# Patient Record
Sex: Male | Born: 1985 | Race: White | Hispanic: No | Marital: Married | Smoking: Current some day smoker
Health system: Southern US, Community
[De-identification: ages and names within clinical notes are randomized; demographics above are authoritative.]

## PROBLEM LIST (undated history)

## (undated) DIAGNOSIS — Z8619 Personal history of other infectious and parasitic diseases: Secondary | ICD-10-CM

## (undated) DIAGNOSIS — B019 Varicella without complication: Secondary | ICD-10-CM

## (undated) HISTORY — PX: KNEE ARTHROSCOPY WITH ANTERIOR CRUCIATE LIGAMENT (ACL) REPAIR: SHX5644

## (undated) HISTORY — DX: Varicella without complication: B01.9

## (undated) HISTORY — DX: Personal history of other infectious and parasitic diseases: Z86.19

---
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
## 2007-04-27 ENCOUNTER — Ambulatory Visit: Payer: Self-pay | Admitting: Family Medicine

## 2010-05-10 ENCOUNTER — Encounter: Payer: Self-pay | Admitting: Family Medicine

## 2010-11-09 ENCOUNTER — Emergency Department (HOSPITAL_COMMUNITY): Payer: 59

## 2010-11-09 ENCOUNTER — Emergency Department (HOSPITAL_COMMUNITY)
Admission: EM | Admit: 2010-11-09 | Discharge: 2010-11-09 | Disposition: A | Discharge status: Home / Self Care | Payer: 59 | Attending: Emergency Medicine | Admitting: Emergency Medicine

## 2010-11-09 DIAGNOSIS — N200 Calculus of kidney: Secondary | ICD-10-CM | POA: Insufficient documentation

## 2010-11-09 DIAGNOSIS — R109 Unspecified abdominal pain: Secondary | ICD-10-CM | POA: Insufficient documentation

## 2010-11-09 DIAGNOSIS — N201 Calculus of ureter: Secondary | ICD-10-CM | POA: Insufficient documentation

## 2010-11-09 DIAGNOSIS — M549 Dorsalgia, unspecified: Secondary | ICD-10-CM | POA: Insufficient documentation

## 2010-11-09 LAB — URINALYSIS, ROUTINE W REFLEX MICROSCOPIC
Glucose, UA: NEGATIVE mg/dL
Leukocytes, UA: NEGATIVE
Nitrite: NEGATIVE
Protein, ur: NEGATIVE mg/dL
Specific Gravity, Urine: 1.02 (ref 1.005–1.030)
Urobilinogen, UA: 0.2 mg/dL (ref 0.0–1.0)

## 2010-11-09 LAB — URINE MICROSCOPIC-ADD ON

## 2012-05-29 ENCOUNTER — Emergency Department (HOSPITAL_COMMUNITY)
Admission: EM | Admit: 2012-05-29 | Discharge: 2012-05-29 | Disposition: A | Discharge status: Home / Self Care | Payer: Worker's Compensation | Attending: Emergency Medicine | Admitting: Emergency Medicine

## 2012-05-29 ENCOUNTER — Emergency Department (HOSPITAL_COMMUNITY): Payer: Worker's Compensation | Admitting: Anesthesiology

## 2012-05-29 ENCOUNTER — Encounter (HOSPITAL_COMMUNITY): Payer: Self-pay | Admitting: Anesthesiology

## 2012-05-29 ENCOUNTER — Encounter (HOSPITAL_COMMUNITY)
Admission: EM | Disposition: A | Discharge status: Home / Self Care | Payer: Self-pay | Source: Home / Self Care | Attending: Emergency Medicine

## 2012-05-29 ENCOUNTER — Encounter (HOSPITAL_COMMUNITY): Payer: Self-pay | Admitting: *Deleted

## 2012-05-29 ENCOUNTER — Emergency Department (HOSPITAL_COMMUNITY): Payer: Worker's Compensation

## 2012-05-29 DIAGNOSIS — W260XXA Contact with knife, initial encounter: Secondary | ICD-10-CM | POA: Insufficient documentation

## 2012-05-29 DIAGNOSIS — S61509A Unspecified open wound of unspecified wrist, initial encounter: Secondary | ICD-10-CM | POA: Insufficient documentation

## 2012-05-29 DIAGNOSIS — S55109A Unspecified injury of radial artery at forearm level, unspecified arm, initial encounter: Secondary | ICD-10-CM | POA: Insufficient documentation

## 2012-05-29 DIAGNOSIS — S51819A Laceration without foreign body of unspecified forearm, initial encounter: Secondary | ICD-10-CM

## 2012-05-29 DIAGNOSIS — Y990 Civilian activity done for income or pay: Secondary | ICD-10-CM | POA: Insufficient documentation

## 2012-05-29 DIAGNOSIS — Y9269 Other specified industrial and construction area as the place of occurrence of the external cause: Secondary | ICD-10-CM | POA: Insufficient documentation

## 2012-05-29 HISTORY — PX: ARTERY REPAIR: SHX559

## 2012-05-29 HISTORY — PX: EMBOLECTOMY: SHX44

## 2012-05-29 LAB — CBC
Hemoglobin: 16 g/dL (ref 13.0–17.0)
MCHC: 34.9 g/dL (ref 30.0–36.0)

## 2012-05-29 LAB — BASIC METABOLIC PANEL
BUN: 13 mg/dL (ref 6–23)
Creatinine, Ser: 1 mg/dL (ref 0.50–1.35)

## 2012-05-29 LAB — PROTIME-INR
INR: 0.99 (ref 0.00–1.49)
Prothrombin Time: 13 seconds (ref 11.6–15.2)

## 2012-05-29 SURGERY — REPAIR, ARTERY, BRACHIAL
Anesthesia: Monitor Anesthesia Care | Site: Wrist | Laterality: Left | Wound class: Dirty or Infected

## 2012-05-29 MED ORDER — PROPOFOL 10 MG/ML IV EMUL
INTRAVENOUS | Status: DC | PRN
  Administered 2012-05-29: 120 ug/kg/min via INTRAVENOUS

## 2012-05-29 MED ORDER — MIDAZOLAM HCL 5 MG/5ML IJ SOLN
INTRAMUSCULAR | Status: DC | PRN
  Administered 2012-05-29 (×2): 1 mg via INTRAVENOUS
  Administered 2012-05-29: 2 mg via INTRAVENOUS

## 2012-05-29 MED ORDER — FENTANYL CITRATE 0.05 MG/ML IJ SOLN
INTRAMUSCULAR | Status: DC | PRN
  Administered 2012-05-29 (×3): 50 ug via INTRAVENOUS

## 2012-05-29 MED ORDER — SODIUM CHLORIDE 0.9 % IR SOLN
Status: DC | PRN
  Administered 2012-05-29: 1

## 2012-05-29 MED ORDER — CEFAZOLIN SODIUM-DEXTROSE 2-3 GM-% IV SOLR
INTRAVENOUS | Status: DC | PRN
  Administered 2012-05-29: 2 g via INTRAVENOUS

## 2012-05-29 MED ORDER — HYDROMORPHONE HCL PF 1 MG/ML IJ SOLN: 0.2500 mg | INTRAMUSCULAR | Status: DC | PRN

## 2012-05-29 MED ORDER — OXYCODONE HCL 5 MG PO TABS: 5.0000 mg | ORAL_TABLET | Freq: Once | ORAL | Status: DC | PRN

## 2012-05-29 MED ORDER — LACTATED RINGERS IV SOLN
INTRAVENOUS | Status: DC | PRN
  Administered 2012-05-29: 18:00:00 via INTRAVENOUS

## 2012-05-29 MED ORDER — OXYCODONE HCL 5 MG/5ML PO SOLN: 5.0000 mg | ORAL | Status: DC | PRN

## 2012-05-29 MED ORDER — PROPOFOL 10 MG/ML IV BOLUS
INTRAVENOUS | Status: DC | PRN
  Administered 2012-05-29: 50 mg via INTRAVENOUS

## 2012-05-29 MED ORDER — DEXTROSE 5 % IV SOLN
INTRAVENOUS | Status: DC | PRN
  Administered 2012-05-29: 19:00:00 via INTRAVENOUS

## 2012-05-29 MED ORDER — LIDOCAINE-EPINEPHRINE 0.5 %-1:200000 IJ SOLN
INTRAMUSCULAR | Status: DC | PRN
  Administered 2012-05-29: 8 mL

## 2012-05-29 MED ORDER — TETANUS-DIPHTH-ACELL PERTUSSIS 5-2.5-18.5 LF-MCG/0.5 IM SUSP
0.5000 mL | Freq: Once | INTRAMUSCULAR | Status: AC
  Administered 2012-05-29: 0.5 mL via INTRAMUSCULAR
  Filled 2012-05-29: qty 0.5

## 2012-05-29 MED ORDER — METOCLOPRAMIDE HCL 5 MG/ML IJ SOLN: 10.0000 mg | Freq: Once | INTRAMUSCULAR | Status: DC | PRN

## 2012-05-29 MED ORDER — SODIUM CHLORIDE 0.9 % IR SOLN
Status: DC | PRN
  Administered 2012-05-29: 20:00:00

## 2012-05-29 MED ORDER — OXYCODONE HCL 5 MG/5ML PO SOLN: 5.0000 mg | Freq: Once | ORAL | Status: DC | PRN

## 2012-05-29 MED ORDER — ONDANSETRON HCL 4 MG/2ML IJ SOLN
INTRAMUSCULAR | Status: DC | PRN
  Administered 2012-05-29: 4 mg via INTRAVENOUS

## 2012-05-29 SURGICAL SUPPLY — 38 items
BANDAGE ESMARK 6X9 LF (GAUZE/BANDAGES/DRESSINGS) ×1 IMPLANT
BENZOIN TINCTURE PRP APPL 2/3 (GAUZE/BANDAGES/DRESSINGS) IMPLANT
BNDG ESMARK 4X9 LF (GAUZE/BANDAGES/DRESSINGS) IMPLANT
BNDG ESMARK 6X9 LF (GAUZE/BANDAGES/DRESSINGS) ×2
CANISTER SUCTION 2500CC (MISCELLANEOUS) ×2 IMPLANT
CATH EMB 3FR 40CM (CATHETERS) ×2 IMPLANT
CLIP LIGATING EXTRA MED SLVR (CLIP) ×2 IMPLANT
CLIP LIGATING EXTRA SM BLUE (MISCELLANEOUS) ×2 IMPLANT
CLOTH BEACON ORANGE TIMEOUT ST (SAFETY) ×2 IMPLANT
CLSR STERI-STRIP ANTIMIC 1/2X4 (GAUZE/BANDAGES/DRESSINGS) ×2 IMPLANT
COVER SURGICAL LIGHT HANDLE (MISCELLANEOUS) ×2 IMPLANT
CUFF TOURNIQUET SINGLE 18IN (TOURNIQUET CUFF) ×2 IMPLANT
CUFF TOURNIQUET SINGLE 24IN (TOURNIQUET CUFF) IMPLANT
CUFF TOURNIQUET SINGLE 34IN LL (TOURNIQUET CUFF) IMPLANT
DECANTER SPIKE VIAL GLASS SM (MISCELLANEOUS) ×2 IMPLANT
ELECT REM PT RETURN 9FT ADLT (ELECTROSURGICAL) ×2
ELECTRODE REM PT RTRN 9FT ADLT (ELECTROSURGICAL) ×1 IMPLANT
GLOVE SS BIOGEL STRL SZ 7.5 (GLOVE) ×1 IMPLANT
GLOVE SUPERSENSE BIOGEL SZ 7.5 (GLOVE) ×1
GOWN STRL NON-REIN LRG LVL3 (GOWN DISPOSABLE) ×6 IMPLANT
KIT BASIN OR (CUSTOM PROCEDURE TRAY) ×2 IMPLANT
KIT ROOM TURNOVER OR (KITS) ×2 IMPLANT
NS IRRIG 1000ML POUR BTL (IV SOLUTION) ×2 IMPLANT
PACK CV ACCESS (CUSTOM PROCEDURE TRAY) ×2 IMPLANT
PAD ARMBOARD 7.5X6 YLW CONV (MISCELLANEOUS) ×4 IMPLANT
PAD CAST 4YDX4 CTTN HI CHSV (CAST SUPPLIES) IMPLANT
PADDING CAST COTTON 4X4 STRL (CAST SUPPLIES)
SPONGE GAUZE 4X4 12PLY (GAUZE/BANDAGES/DRESSINGS) IMPLANT
STAPLER VISISTAT 35W (STAPLE) IMPLANT
STRIP CLOSURE SKIN 1/2X4 (GAUZE/BANDAGES/DRESSINGS) IMPLANT
SUT PROLENE 6 0 CC (SUTURE) ×4 IMPLANT
SUT VIC AB 3-0 SH 27 (SUTURE) ×1
SUT VIC AB 3-0 SH 27X BRD (SUTURE) ×1 IMPLANT
TAPE CLOTH SURG 4X10 WHT LF (GAUZE/BANDAGES/DRESSINGS) ×2 IMPLANT
TOWEL OR 17X24 6PK STRL BLUE (TOWEL DISPOSABLE) ×2 IMPLANT
TOWEL OR 17X26 10 PK STRL BLUE (TOWEL DISPOSABLE) ×2 IMPLANT
UNDERPAD 30X30 INCONTINENT (UNDERPADS AND DIAPERS) ×2 IMPLANT
WATER STERILE IRR 1000ML POUR (IV SOLUTION) IMPLANT

## 2012-05-29 NOTE — ED Notes (Signed)
Pt reports numbness to left thumb.  No blood thinners.

## 2012-05-29 NOTE — Transfer of Care (Signed)
Immediate Anesthesia Transfer of Care Note  Patient: Larry Hicks  Procedure(s) Performed: Procedure(s): Repair of Left Radial Artery (Left) Left Radial Artery Embolectomy (Left)  Patient Location: PACU  Anesthesia Type:MAC  Level of Consciousness: awake, alert , oriented and patient cooperative  Airway & Oxygen Therapy: Patient Spontanous Breathing and Patient connected to nasal cannula oxygen  Post-op Assessment: Report given to PACU RN and Post -op Vital signs reviewed and stable  Post vital signs: Reviewed and stable  Complications: No apparent anesthesia complications

## 2012-05-29 NOTE — Preoperative (Signed)
Beta Blockers   Reason not to administer Beta Blockers:Not Applicable 

## 2012-05-29 NOTE — H&P (Signed)
  27 yo had laceration at work to left radial artery.  Unable to control in ED.  To OR for repair  PMH; no med issues  PE alert oriented Dressing on left wrist  To OR for repair

## 2012-05-29 NOTE — ED Notes (Signed)
PT is here with left wrist laceration from box cutter accidentally.  Pt reports that it was squirting blood.  Bleeding appears controlled at present and has pressure dressing applied

## 2012-05-29 NOTE — Anesthesia Preprocedure Evaluation (Addendum)
Anesthesia Evaluation  Patient identified by MRN, date of birth, ID band Patient awake    Reviewed: Allergy & Precautions, H&P , NPO status , Patient's Chart, lab work & pertinent test results  History of Anesthesia Complications Negative for: history of anesthetic complications  Airway Mallampati: II TM Distance: >3 FB Neck ROM: full    Dental  (+) Teeth Intact and Dental Advisory Given   Pulmonary Current Smoker,  breath sounds clear to auscultation        Cardiovascular negative cardio ROS  Rhythm:regular     Neuro/Psych negative neurological ROS  negative psych ROS   GI/Hepatic negative GI ROS, Neg liver ROS,   Endo/Other  negative endocrine ROS  Renal/GU negative Renal ROS  negative genitourinary   Musculoskeletal   Abdominal   Peds  Hematology negative hematology ROS (+)   Anesthesia Other Findings See surgeon's H&P   Reproductive/Obstetrics negative OB ROS                          Anesthesia Physical Anesthesia Plan  ASA: II and emergent  Anesthesia Plan: MAC   Post-op Pain Management:    Induction: Intravenous  Airway Management Planned: Simple Face Mask  Additional Equipment:   Intra-op Plan:   Post-operative Plan:   Informed Consent: I have reviewed the patients History and Physical, chart, labs and discussed the procedure including the risks, benefits and alternatives for the proposed anesthesia with the patient or authorized representative who has indicated his/her understanding and acceptance.   Dental Advisory Given  Plan Discussed with: CRNA and Surgeon  Anesthesia Plan Comments:         Anesthesia Quick Evaluation

## 2012-05-29 NOTE — Op Note (Signed)
OPERATIVE REPORT  DATE OF SURGERY: 05/29/2012  PATIENT: Larry Hicks, 27 y.o. male MRN: [id number]  DOB: 1985-09-11  PRE-OPERATIVE DIAGNOSIS: Laceration with left radial artery injury  POST-OPERATIVE DIAGNOSIS:  Same  PROCEDURE: Exploration of radial artery and primary end-to-end repair  SURGEON:  Gretta Began, M.D.   ASSISTANT: Nurse  ANESTHESIA:  Local with sedation  EBL: 30 cc ml  Total I/O In: 950 [I.V.:950] Out: 30 [Blood:30]  BLOOD ADMINISTERED: None  DRAINS: None  SPECIMEN: None  COUNTS CORRECT:  YES  PLAN OF CARE: PACU   PATIENT DISPOSITION:  PACU - hemodynamically stable  PROCEDURE DETAILS: Indication for procedure the patient is a 27 year old gentleman who injured his left wrist with a box cutter on the job this evening. He presented to the emergency department had a continued arterial bleeding uncontrollable in the emergency department. He was taken to the operating room for exploration. In the emergency department the patient was noted to have some numbness in his left thumb but no motor loss.  The patient was taken to the operating room where a pneumatic tourniquet was placed at his bicep area. The dressing was left intact and his leg was elevated and exsanguinated and Esmarch tourniquet and the pneumatic tourniquet was inflated 250 mm of mercury. The dressing was removed and the patient had a puncture laceration right directly over his radial artery. The hand and arm were prepped and draped in usual sterile fashion. Using local anesthesia the incision was anesthetized and was extended proximally and distally with a 10 blade. On exploration the radial artery had been completely transected. This was a relatively clean cut with no evidence of injury other than the transection. The proximal and distal radial artery were occluded with Serafin clamps. A pneumatic tourniquet was then deflated. Total tourniquet time was 16 minutes. There was some venous bleeding this was  controlled with hemoclips. There was no evidence of nerve injury. The radial artery had retracted and specimen was not actively bleeding on removal of the Serafin clamp. 3 Fogarty catheter was passed proximally through the proximal and distally to the distal end. No clot was removed and there was good bleeding with both inflow and outflow. The artery was spatulated both in the proximal and distal portion. Using a 6-0 Prolene suture the radial artery was sewn end-to-end to itself. Prior to completion of the anastomosis a 2 dilator passed easily proximally and distally through the artery. The anastomosis was completed and the Serafin clamps removed. A good Doppler flow was noted through the repair and also into the hand. The patient's palmar arch was easily auscultated with the Doppler. Patient did lose a flow on compression of the ulnar artery. The wounds were irrigated with saline. Hemostasis obtained electrocautery. The wound was closed with 3-0 Vicryl in the subcutaneous and subcuticular tissue. Benzoin Steri-Strips were applied   Gretta Began, M.D. 05/29/2012 8:04 PM

## 2012-05-29 NOTE — ED Provider Notes (Signed)
History     CSN: [id number]  Arrival date & time 05/29/12  1506   First MD Initiated Contact with Patient 05/29/12 1529      Chief Complaint  Patient presents with  . left wrist laceration from box cutter     (Consider location/radiation/quality/duration/timing/severity/associated sxs/prior treatment) HPI Comments: 27 y/o male p/w lac to left forearm. Occurred just PTA. Using box cutter. Cutting towards himself. Slipped and cut wrist. Reports severe pulsatile blood. Some numbness to thumb. No other injuries. Unsure last TDAP.  Patient is a 27 y.o. male presenting with arm injury. The history is provided by the patient.  Arm Injury Location:  Arm Arm location:  L forearm Pain details:    Quality:  Aching   Radiates to:  Does not radiate   Severity:  Moderate   Onset quality:  Sudden   Timing:  Constant   Progression:  Unchanged Chronicity:  New Foreign body present:  Unable to specify Tetanus status:  Unknown Prior injury to area:  No Relieved by:  Nothing Worsened by:  Nothing tried Ineffective treatments:  None tried Associated symptoms: no fever     History reviewed. No pertinent past medical history.  Past Surgical History  Procedure Laterality Date  . Knee arthroscopy with anterior cruciate ligament (acl) repair      No family history on file.  History  Substance Use Topics  . Smoking status: Current Every Day Smoker  . Smokeless tobacco: Not on file  . Alcohol Use: No      Review of Systems  Constitutional: Negative for fever and chills.  HENT: Negative for congestion and rhinorrhea.   Respiratory: Negative for cough and shortness of breath.   Cardiovascular: Negative for chest pain.  Gastrointestinal: Negative for nausea, vomiting and abdominal pain.  Genitourinary: Negative for flank pain and difficulty urinating.  Skin: Positive for wound. Negative for rash.  Neurological: Negative for dizziness and headaches.  All other systems reviewed and  are negative.    Allergies  Review of patient's allergies indicates no known allergies.  Home Medications  No current outpatient prescriptions on file.  BP 162/87  Pulse 85  Temp(Src) 98.9 F (37.2 C) (Oral)  Resp 20  Ht 6' (1.829 m)  Wt 190 lb (86.183 kg)  BMI 25.76 kg/m2  SpO2 99%  Physical Exam  Nursing note and vitals reviewed. Constitutional: He is oriented to person, place, and time. He appears well-developed and well-nourished. No distress.  HENT:  Head: Normocephalic and atraumatic.  Eyes: Conjunctivae are normal. Right eye exhibits no discharge. Left eye exhibits no discharge.  Neck: No tracheal deviation present.  Cardiovascular: Normal heart sounds and intact distal pulses.   Pulmonary/Chest: Effort normal and breath sounds normal. No stridor. No respiratory distress. He has no wheezes. He has no rales.  Abdominal: Soft. He exhibits no distension. There is no tenderness. There is no guarding.  Musculoskeletal: He exhibits tenderness. He exhibits no edema.       Arms: Neurological: He is alert and oriented to person, place, and time.  Skin: Skin is warm and dry.  Psychiatric: He has a normal mood and affect. His behavior is normal.    ED Course  Procedures (including critical care time)  Labs Reviewed  CBC - Abnormal; Notable for the following:    WBC 14.3 (*)    All other components within normal limits  PROTIME-INR  BASIC METABOLIC PANEL   Dg Wrist Complete Left  05/29/2012  *RADIOLOGY REPORT*  Clinical Data: Soft  tissue laceration  LEFT WRIST - COMPLETE 3+ VIEW  Comparison: None.  Findings: Soft tissue injury is seen along the radial side.  No evidence of fracture or radiopaque foreign object.  There is either an old ulnar styloid fracture or ossicle.  IMPRESSION: No acute bony finding.  Soft tissue injury along the radial and volar side.   Original Report Authenticated By: Paulina Fusi, M.D.      1. Forearm laceration   2. Radial artery injury        MDM   27 y/o male p/w wrist lac. No other injuries.  tdap given. X-ray as above. allens test with poor circulation from radial artery. Hand with good circulation upon releasing pressure over ulnar artery. Irrigated wound with 1L NS after giving about 2-3cc of lidocaine without epi. Upon exploration able to visualize pulsatile bleeding from site, bright red. Unable to fully visualize artery.  Vascular surgery consulted. To go to OR for further mgmt.  Labs and imaging reviewed by myself and considered in medical decision making if ordered. Imaging interpreted by radiology.   Discussed case with Dr. Hyacinth Meeker who is in agreement with assessment and plan.     Stevie Kern, MD 05/29/12 [phone]

## 2012-05-29 NOTE — ED Notes (Signed)
Assisted Roxy Cedar, Res MD with undressing and examination of laceration to left forearm and then redressing it with assistance from [place], [place]

## 2012-05-29 NOTE — Anesthesia Postprocedure Evaluation (Signed)
Anesthesia Post Note  Patient: Larry Hicks  Procedure(s) Performed: Procedure(s) (LRB): Repair of Left Radial Artery (Left) Left Radial Artery Embolectomy (Left)  Anesthesia type: MAC  Patient location: PACU  Post pain: Pain level controlled  Post assessment: Patient's Cardiovascular Status Stable  Last Vitals:  Filed Vitals:   05/29/12 2020  BP: 145/81  Pulse: 62  Temp:   Resp: 14    Post vital signs: Reviewed and stable  Level of consciousness: alert  Complications: No apparent anesthesia complications

## 2012-05-30 ENCOUNTER — Encounter (HOSPITAL_COMMUNITY): Payer: Self-pay | Admitting: Vascular Surgery

## 2012-05-30 ENCOUNTER — Other Ambulatory Visit: Payer: Self-pay | Admitting: *Deleted

## 2012-05-30 DIAGNOSIS — T148XXA Other injury of unspecified body region, initial encounter: Secondary | ICD-10-CM

## 2012-05-30 MED ORDER — OXYCODONE-ACETAMINOPHEN 5-325 MG PO TABS: 1.0000 | ORAL_TABLET | ORAL | Status: DC | PRN

## 2012-05-30 NOTE — ED Provider Notes (Signed)
Cut with boxcutter by accident while working to the L voral distal forearm over the radial artery - initially have severe pulsatile flow of blood - this improved in ED but while evaluating the wound under local anesthesia the pt has recurrent of significant pulsatile blood - pressure dressing placed and vascular consulted by Dr. Roxy Cedar - Dr Early to take pt to the OR for exploration and repair.  On my exam has normal sensation but decrased flow when Ulnar artery is compressed.  I saw and evaluated the patient, reviewed the resident's note and I agree with the findings and plan.   Vida Roller, MD 05/30/12 [phone]

## 2012-06-19 ENCOUNTER — Encounter: Payer: Self-pay | Admitting: Vascular Surgery

## 2012-06-20 ENCOUNTER — Encounter: Payer: Self-pay | Admitting: Vascular Surgery

## 2012-06-20 ENCOUNTER — Ambulatory Visit (INDEPENDENT_AMBULATORY_CARE_PROVIDER_SITE_OTHER): Payer: Worker's Compensation | Admitting: Vascular Surgery

## 2012-06-20 VITALS — BP 156/89 | HR 80 | Resp 20 | Ht 72.0 in | Wt 195.0 lb

## 2012-06-20 DIAGNOSIS — T148XXA Other injury of unspecified body region, initial encounter: Secondary | ICD-10-CM

## 2012-06-20 NOTE — Progress Notes (Signed)
The stay for followup of traumatic laceration of left radial artery. He had an injury just above the wrist at work with significant bleeding at that time. Surgery date was 05/29/2012. He had transection of his radial artery at mobilization of this and and and repair.  His incision is healing quite nicely with normal healing ridge. He did have a Vicryl stitch exposed 1 end of the incision this was removed. He does have a palpable radial and ulnar pulse and a good Doppler signal in the digital artery of this. He fortunately has no neurologic deficit.  Impression and plan: Stable followup after  Left radial artery transection.will continue full activities and see [place] on an as-needed basis

## 2012-10-09 ENCOUNTER — Other Ambulatory Visit: Payer: Self-pay | Admitting: Occupational Medicine

## 2012-10-09 ENCOUNTER — Ambulatory Visit: Payer: Self-pay

## 2012-10-09 DIAGNOSIS — Z021 Encounter for pre-employment examination: Secondary | ICD-10-CM

## 2013-02-22 ENCOUNTER — Other Ambulatory Visit: Payer: Self-pay

## 2013-03-19 IMAGING — CT CT ABD-PELV W/O CM
1 series · 16 of 32 positions shown, 20 images · non-contrast
Comparison: 07/11/2005

CLINICAL DATA: Back pain and hematuria.

CT ABDOMEN AND PELVIS WITHOUT CONTRAST
TECHNIQUE: Multidetector CT imaging of the abdomen and pelvis was
performed following the standard protocol without intravenous
contrast.

[Series 4: lung windows · axial · 0.74mm/px · z∈[-160,-15]mm · 16 of 34 slices shown, 20 images]
[im 3/34  soft-tissue]
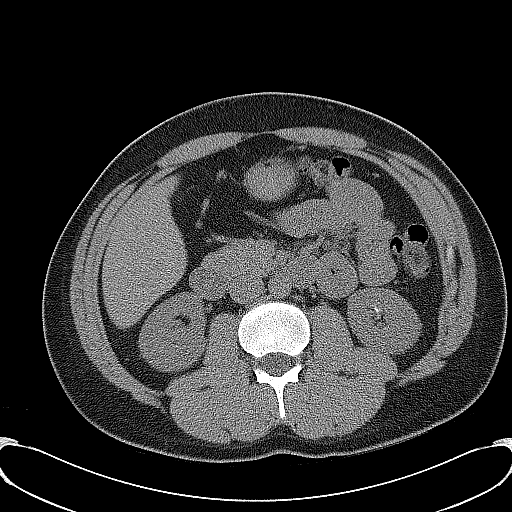
[im 3/34  bone]
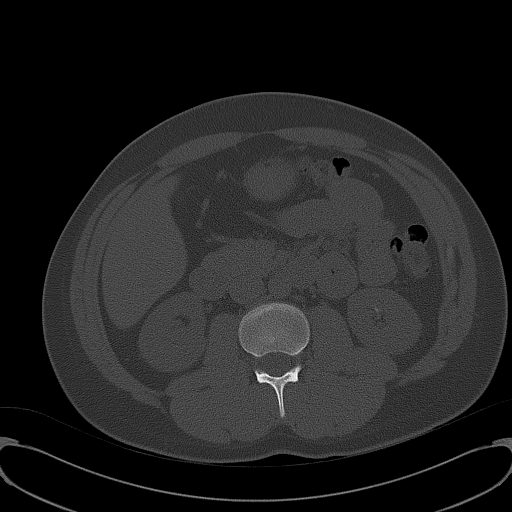
[im 5/34  soft-tissue]
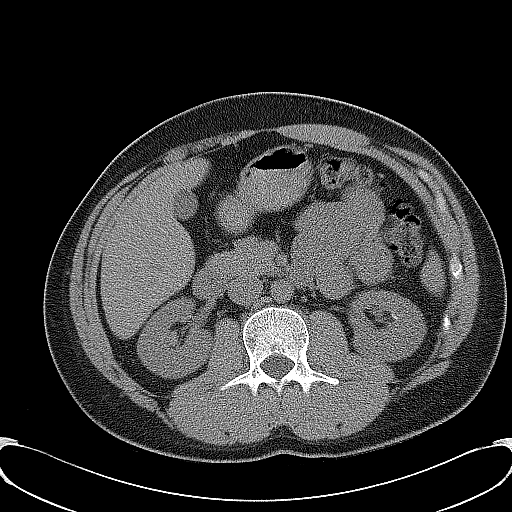
[im 7/34  soft-tissue]
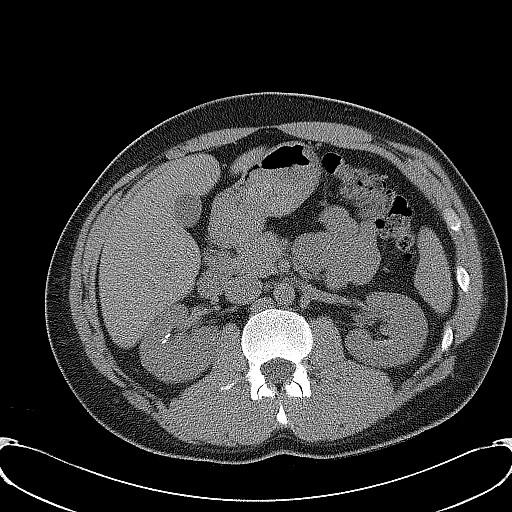
[im 9/34  soft-tissue]
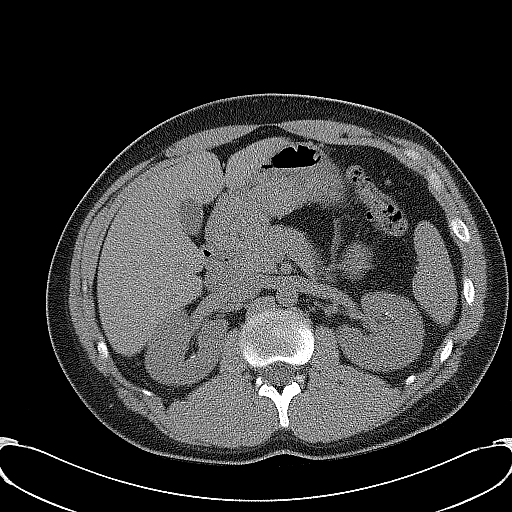
[im 11/34  soft-tissue]
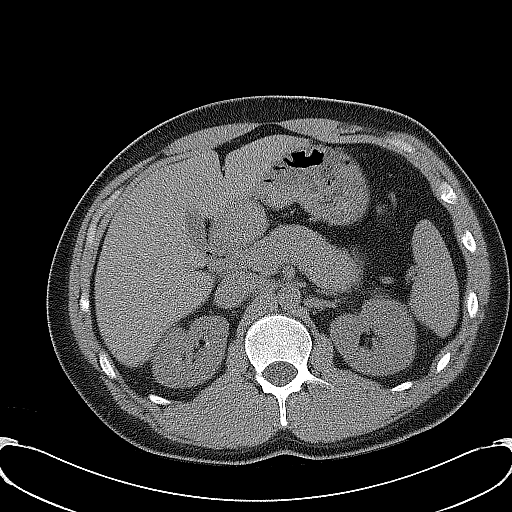
[im 13/34  soft-tissue]
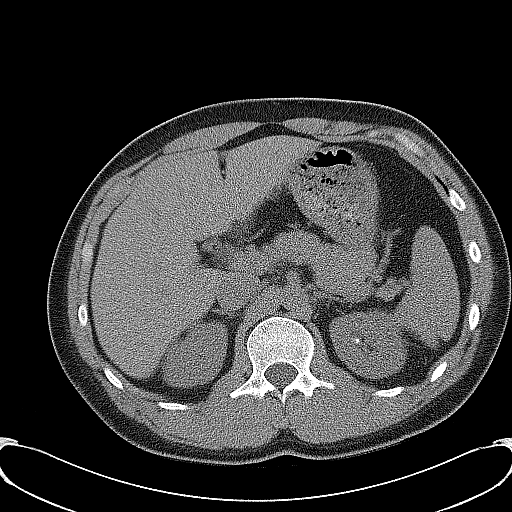
[im 15/34  soft-tissue]
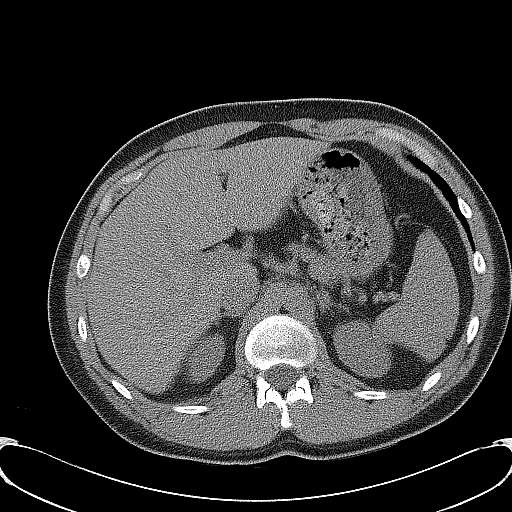
[im 19/34  soft-tissue]
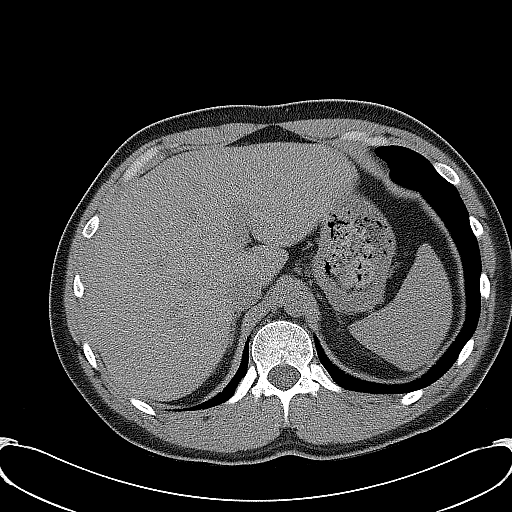
[im 21/34  soft-tissue]
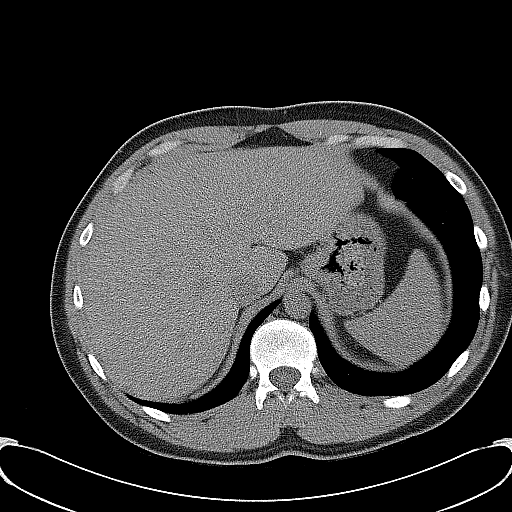
[im 21/34  bone]
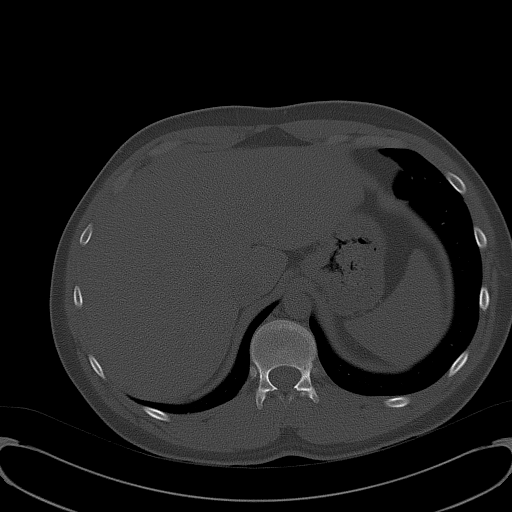
[im 23/34  soft-tissue]
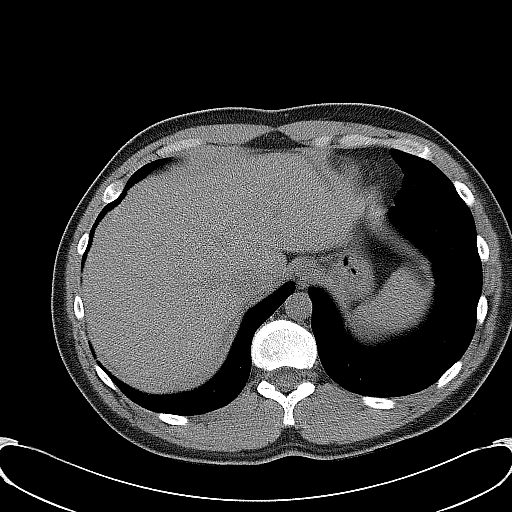
[im 25/34  soft-tissue]
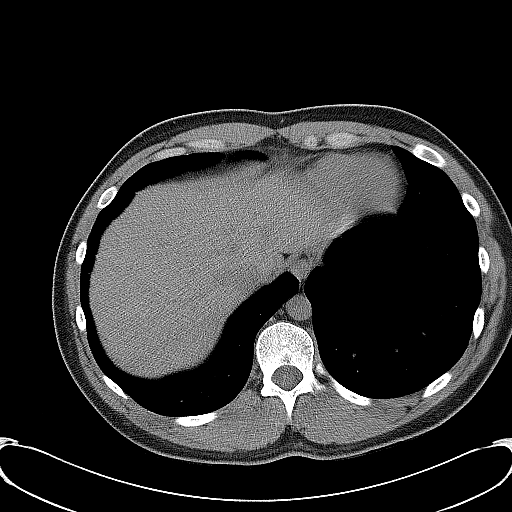
[im 27/34  soft-tissue]
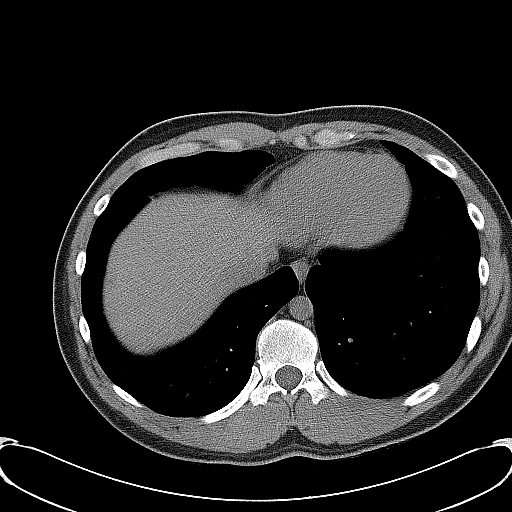
[im 29/34  soft-tissue]
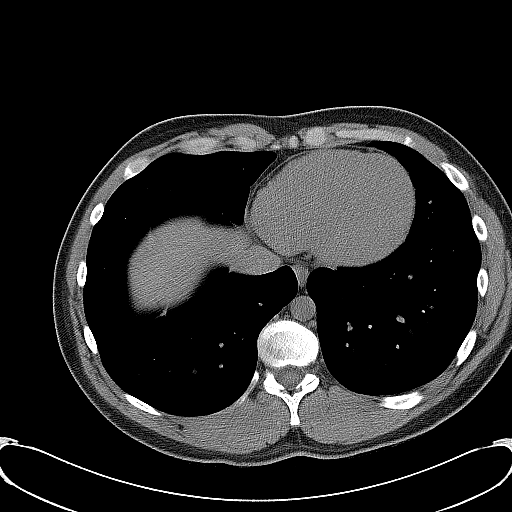
[im 29/34  lung]
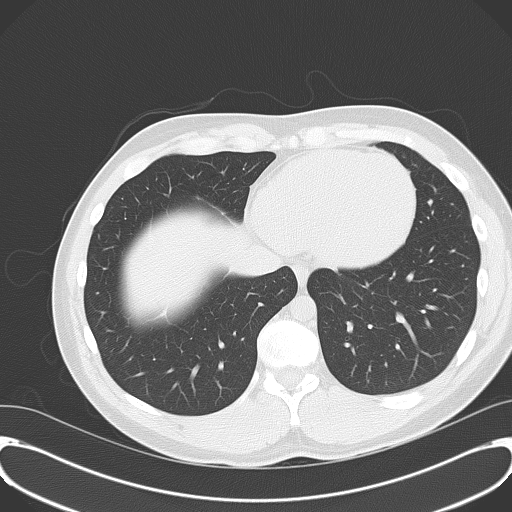
[im 30/34  lung]
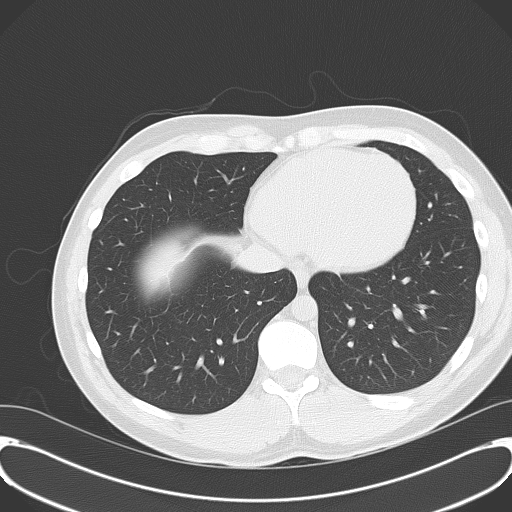
[im 31/34  soft-tissue]
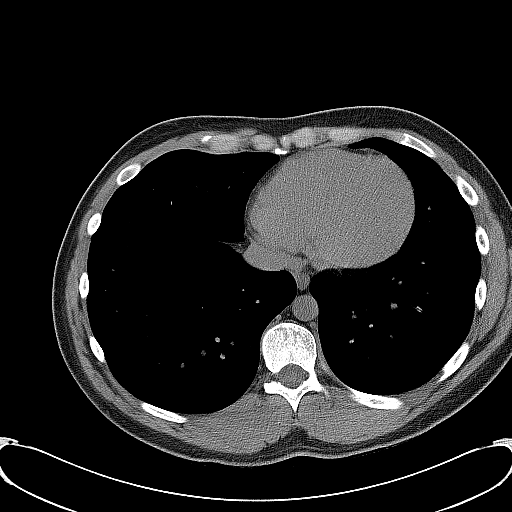
[im 31/34  lung]
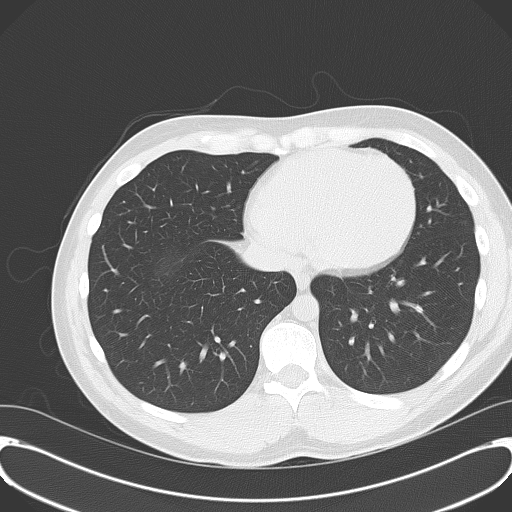
[im 32/34  lung]
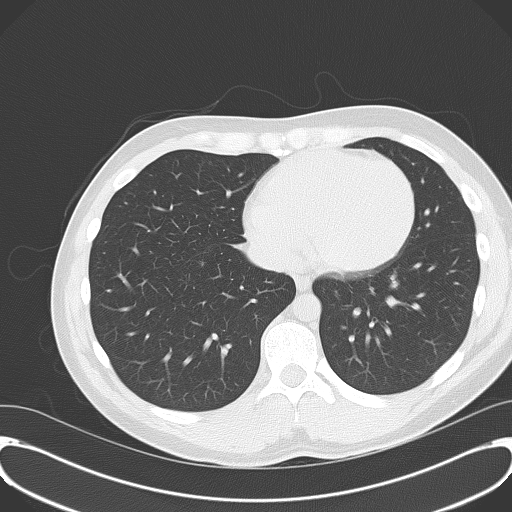

[16 of 32 positions shown; findings below may reference images not displayed]

FINDINGS: Numerous small renal calculi bilaterally, measuring
approximately 3-5 mm each.  These  are widely distributed  through
upper and  lower poles bilaterally and may be due to
nephrocalcinosis.

3 mm stone in the distal right ureter at the level of the  bladder
causes no significant hydronephrosis.

Lung bases are clear.  Liver and spleen and pancreas are normal.
No bowel obstruction.
IMPRESSION: Numerous small renal calculi bilaterally.

3 mm stone in the distal right ureter without significant
hydronephrosis.

## 2014-10-07 IMAGING — CR DG WRIST COMPLETE 3+V*L*
3 series · 3 of 3 positions shown · non-contrast
Comparison: None.

CLINICAL DATA: Soft tissue laceration

LEFT WRIST - COMPLETE 3+ VIEW

[x wrist pa left]
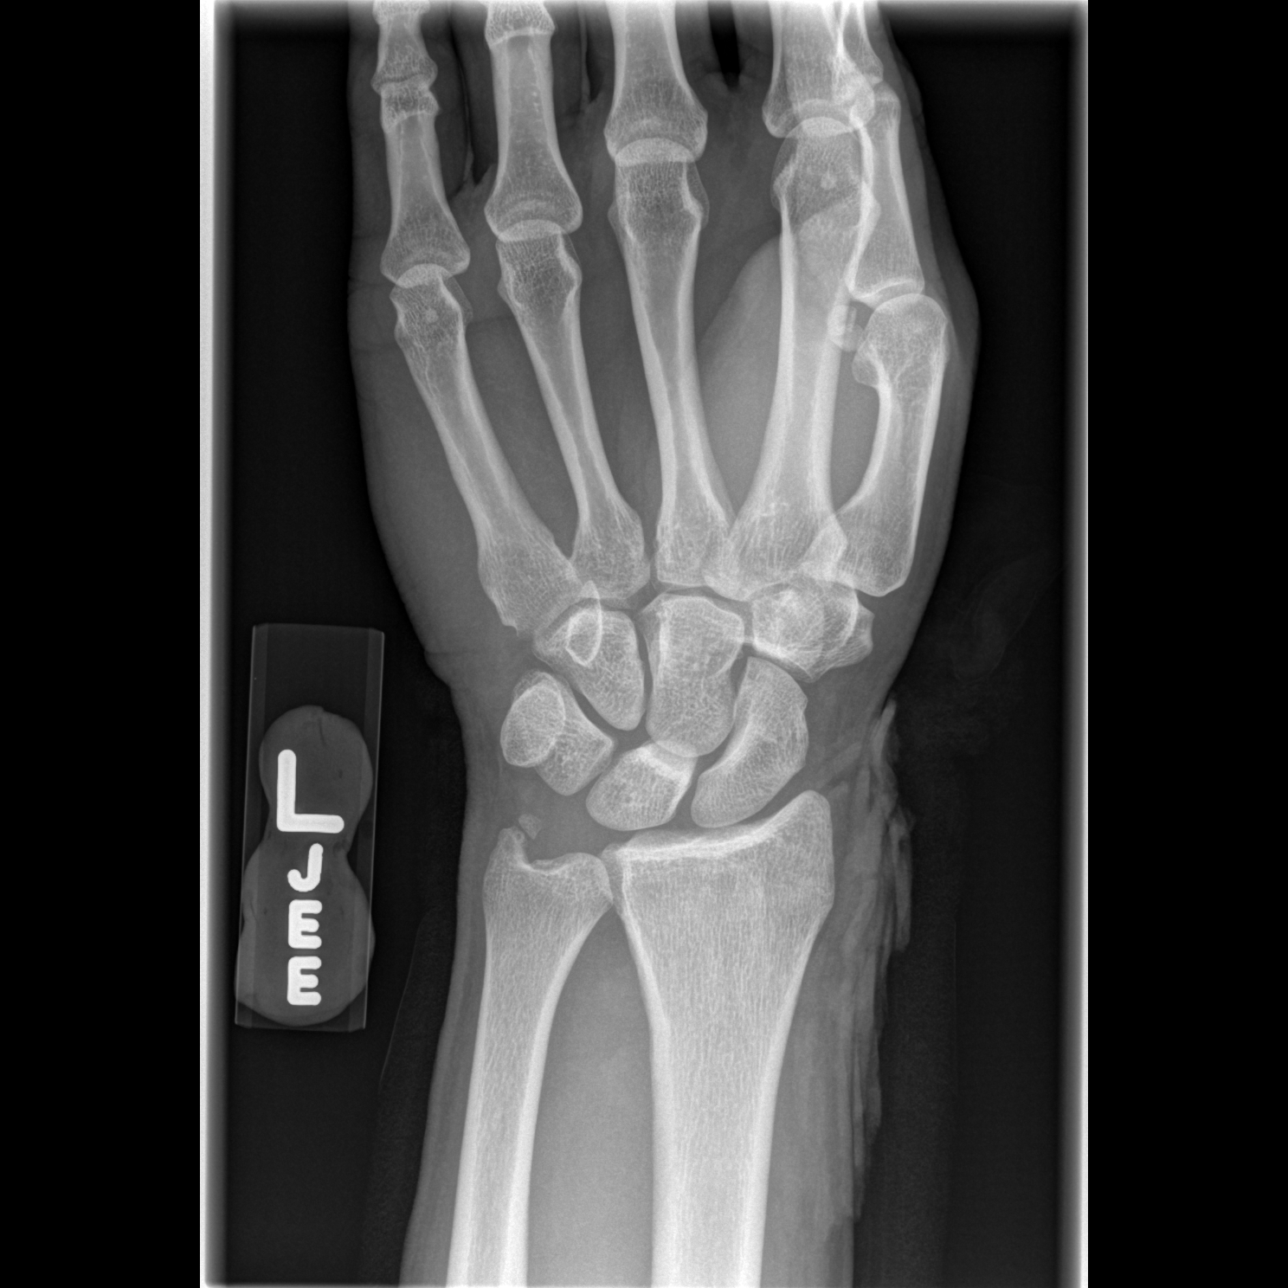

[x wrist obl left]
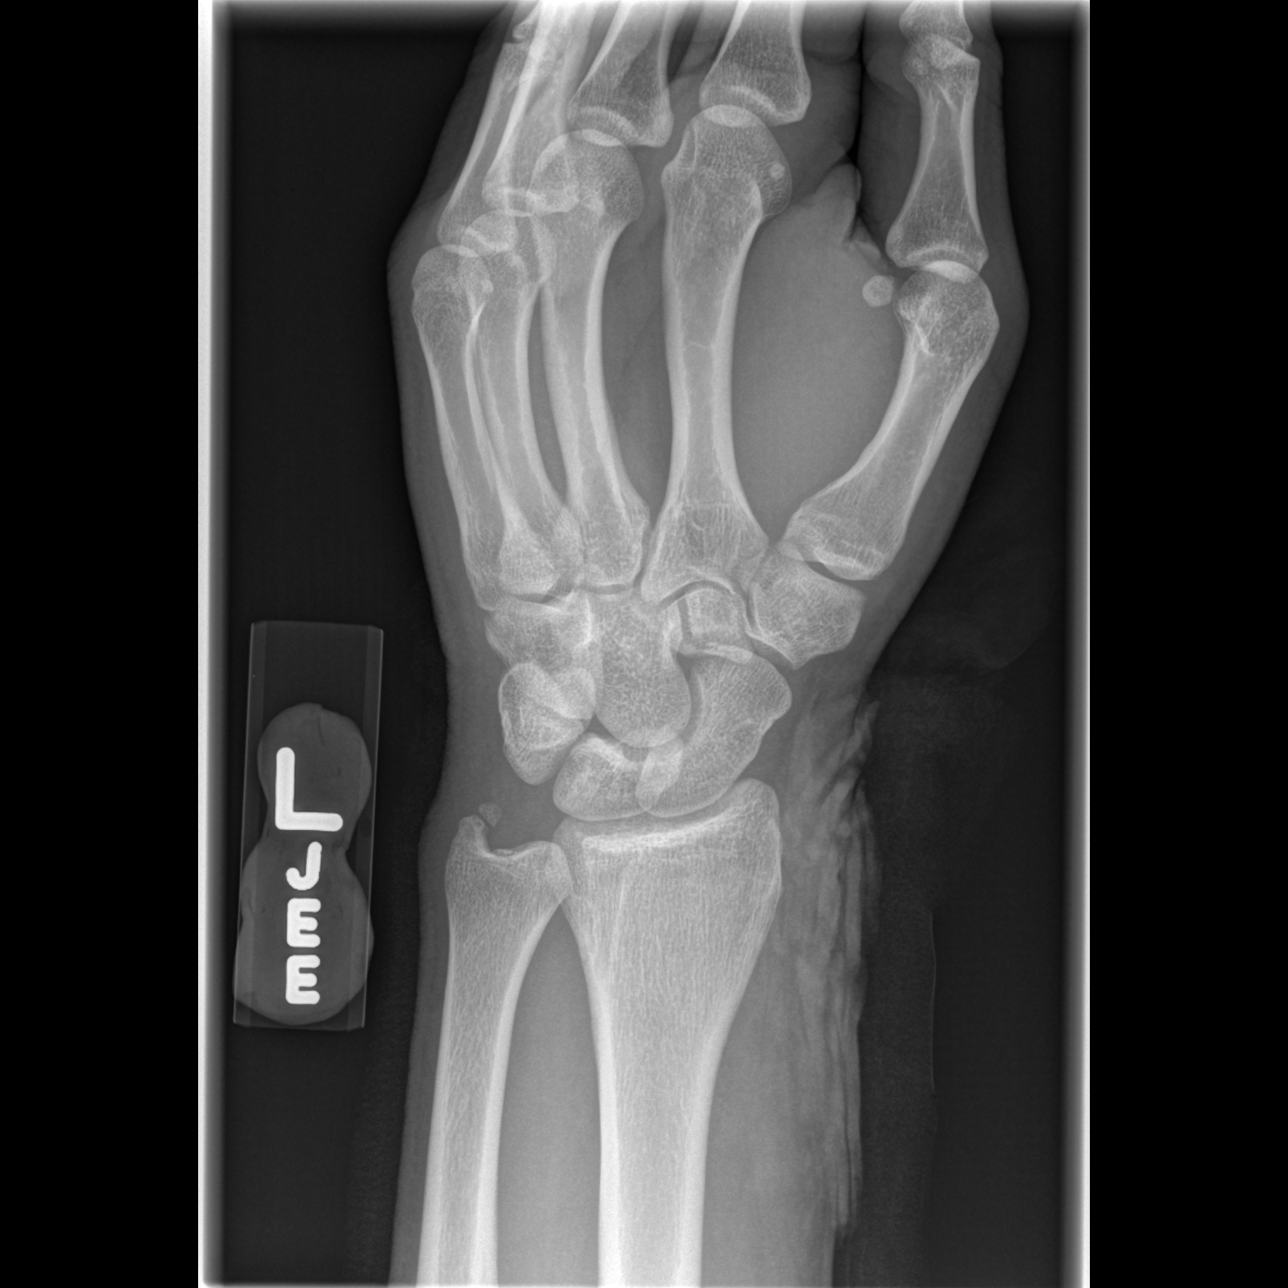

[x wrist lat left]
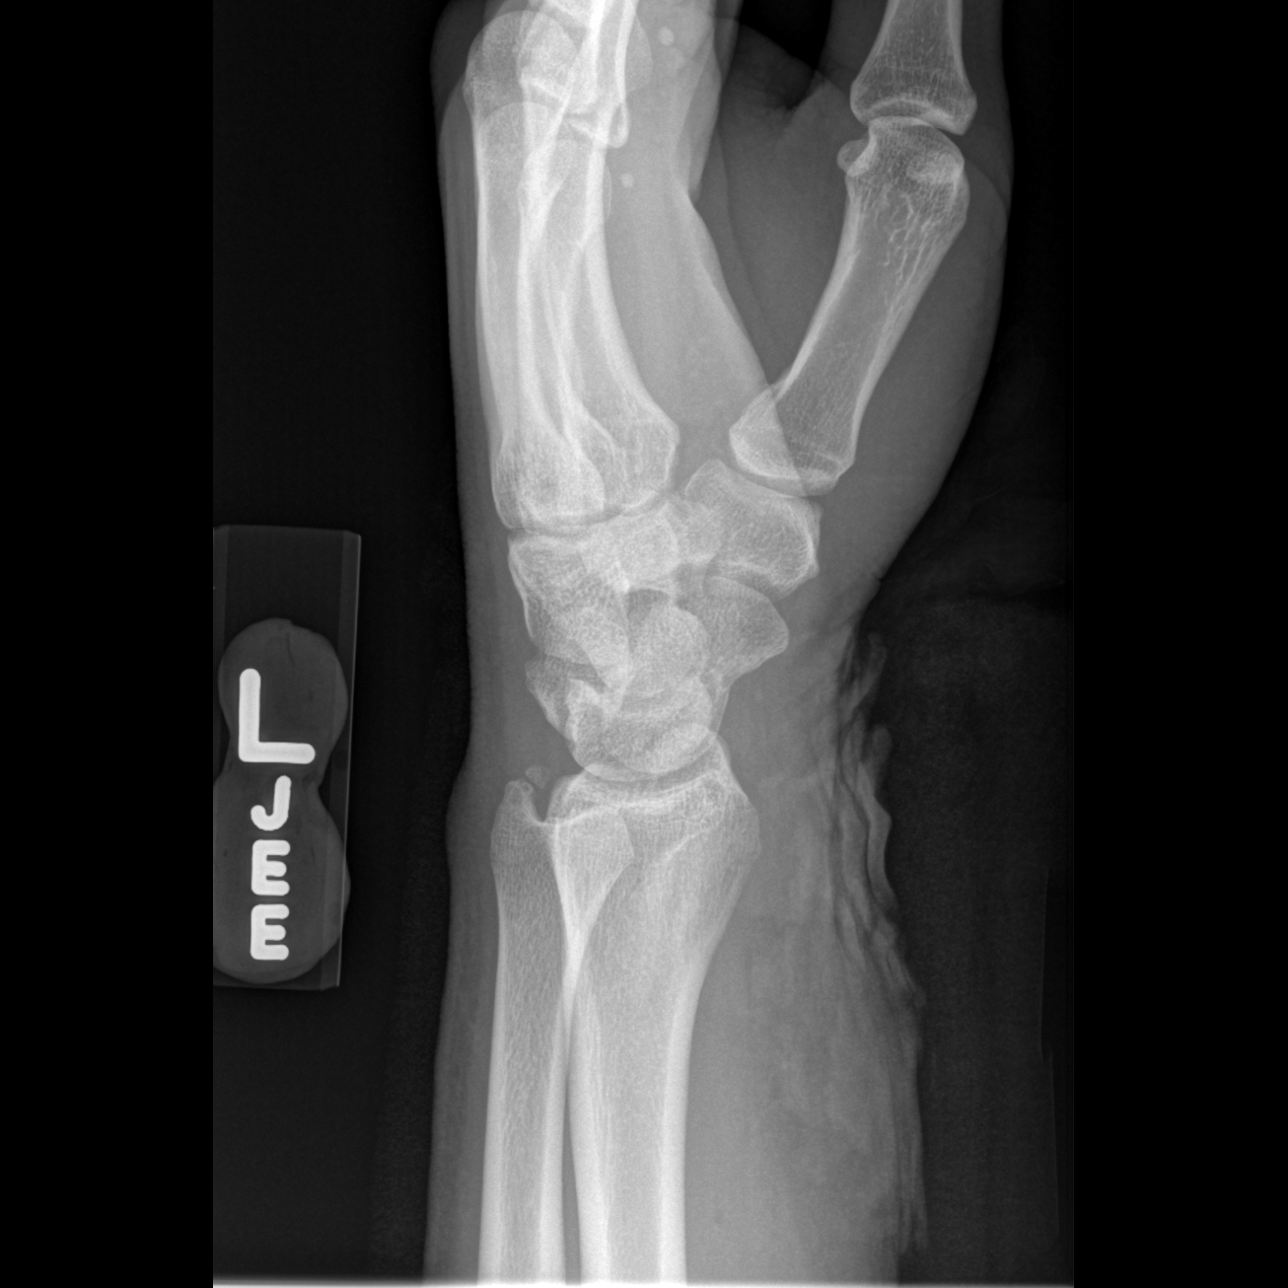

[3 of 3 positions shown; findings below may reference images not displayed]

FINDINGS: Soft tissue injury is seen along the radial side.  No
evidence of fracture or radiopaque foreign object.  There is either
an old ulnar styloid fracture or ossicle.
IMPRESSION: No acute bony finding.  Soft tissue injury along the radial and
volar side.

## 2016-01-08 ENCOUNTER — Encounter: Payer: Self-pay | Admitting: Family Medicine

## 2016-01-08 ENCOUNTER — Ambulatory Visit (INDEPENDENT_AMBULATORY_CARE_PROVIDER_SITE_OTHER): Payer: 59 | Admitting: Family Medicine

## 2016-01-08 VITALS — BP 140/80 | HR 91 | Temp 98.1°F | Ht 72.0 in | Wt 228.0 lb

## 2016-01-08 DIAGNOSIS — Z131 Encounter for screening for diabetes mellitus: Secondary | ICD-10-CM

## 2016-01-08 DIAGNOSIS — E669 Obesity, unspecified: Secondary | ICD-10-CM | POA: Diagnosis not present

## 2016-01-08 DIAGNOSIS — IMO0001 Reserved for inherently not codable concepts without codable children: Secondary | ICD-10-CM

## 2016-01-08 DIAGNOSIS — Z13220 Encounter for screening for lipoid disorders: Secondary | ICD-10-CM | POA: Diagnosis not present

## 2016-01-08 DIAGNOSIS — Z720 Tobacco use: Secondary | ICD-10-CM | POA: Diagnosis not present

## 2016-01-08 DIAGNOSIS — R030 Elevated blood-pressure reading, without diagnosis of hypertension: Secondary | ICD-10-CM

## 2016-01-08 DIAGNOSIS — Z0000 Encounter for general adult medical examination without abnormal findings: Secondary | ICD-10-CM

## 2016-01-08 DIAGNOSIS — Z87898 Personal history of other specified conditions: Secondary | ICD-10-CM

## 2016-01-08 DIAGNOSIS — Z114 Encounter for screening for human immunodeficiency virus [HIV]: Secondary | ICD-10-CM

## 2016-01-08 MED ORDER — NICOTINE 14 MG/24HR TD PT24: 14.0000 mg | MEDICATED_PATCH | Freq: Every day | TRANSDERMAL | 0 refills | Status: AC

## 2016-01-08 MED ORDER — NICOTINE 7 MG/24HR TD PT24: 7.0000 mg | MEDICATED_PATCH | Freq: Every day | TRANSDERMAL | 0 refills | Status: AC

## 2016-01-08 MED ORDER — NICOTINE 21 MG/24HR TD PT24: 21.0000 mg | MEDICATED_PATCH | Freq: Every day | TRANSDERMAL | 0 refills | Status: AC

## 2016-01-08 NOTE — Progress Notes (Signed)
Pre visit review using our clinic review tool, if applicable. No additional management support is needed unless otherwise documented below in the visit note. 

## 2016-01-08 NOTE — Progress Notes (Signed)
Chief Complaint  Patient presents with  . Establish Care    pt requesting check-up   Well Male [place] [name] is here for a complete physical.   His last physical was >1 year(s) ago.  Current diet: in general, a "healthy" diet   Current exercise: none Weight trend: stable Does pt snore? No. Daytime fatigue? Yes- does have a 3 mo son and he wakes up frequently throughout the night Sexually active? Yes.   Seat belt? Yes.     Smoking Smokes 1/2 daily, would like to quit. He tried nicotine gum on one occasion and got terrible heartburn. Otherwise, has not tried any particular method yet. He has been smoking for around over 10 years. He tends to smoke more when he is away from his children and when he consumes alcohol.  Arm issue Woke up one day and felt that his L arm was asleep (pins and needles sensation). He didn't think much of it, except that it didn't go away for 2 hours. This has never happened before, and has not happened since. He does not have any weakness and there was no injury.  Past Medical History:  Diagnosis Date  . History of chicken pox     Past Surgical History:  Procedure Laterality Date  . ARTERY REPAIR Left 05/29/2012   Procedure: Repair of Left Radial Artery;  Surgeon: Larina Earthlyodd F Early, MD;  [facility];  Service: Vascular;  Laterality: Left;  . EMBOLECTOMY Left 05/29/2012   Procedure: Left Radial Artery Embolectomy;  Surgeon: Larina Earthlyodd F Early, MD;  [facility];  Service: Vascular;  Laterality: Left;  . KNEE ARTHROSCOPY WITH ANTERIOR CRUCIATE LIGAMENT (ACL) REPAIR     Medications  Current Outpatient Prescriptions on File Prior to Visit  Medication Sig Dispense Refill  . omeprazole (PRILOSEC) 20 MG capsule Take 20 mg by mouth daily.     Allergies No Known Allergies Family History Family History  Problem Relation Age of Onset  . Diabetes Father    Social History   Social History  . Marital status: Married   Social History Main Topics  . Smoking  status: Current Some Day Smoker    Types: Cigarettes  . Smokeless tobacco: Former NeurosurgeonUser    Quit date: 06/20/2005  . Alcohol use No  . Drug use: No   Review of Systems: Constitutional:  no unexpected change in weight, no fevers or chills Eye:  no recent significant change in vision Ear/Nose/Mouth/Throat:  Ears:  no tinnitus or hearing loss Nose/Mouth/Throat:  no complaints of nasal congestion or bleeding, no sore throat and oral sores Cardiovascular:  no chest pain, no palpitations Respiratory:  no cough and no shortness of breath Gastrointestinal:  no abdominal pain, no change in bowel habits, no nausea, vomiting, diarrhea, or constipation and no black or bloody stool GU:  Male: negative for dysuria, frequency, and incontinence and negative for prostate symptoms Musculoskeletal/Extremities:  no pain, redness, or swelling of the joints Integumentary (Skin/Breast):  no abnormal skin lesions reported Neurologic:  no headaches, +one episode of numbness/tingling Endocrine:  weight changes, masses in the neck, heat/cold intolerance, bowel or skin changes, or cardiovascular system symptoms Hematologic/Lymphatic:  no abnormal bleeding, no HIV risk factors, no night sweats, no swollen nodes, no weight loss  Exam BP 140/80 (BP Location: Left Arm, Patient Position: Sitting, Cuff Size: Large)   Pulse 91   Temp 98.1 F (36.7 C) (Oral)   Ht 6' (1.829 m)   Wt 228 lb (103.4 kg)  SpO2 98%   BMI 30.92 kg/m  General:  well developed, well nourished, in no apparent distress Skin:  no significant moles, warts, or growths Head:  no masses, lesions, or tenderness Eyes:  pupils equal and round, sclera anicteric without injection Ears:  canals without lesions, TMs shiny without retraction, no obvious effusion, no erythema Nose:  nares patent, septum midline, mucosa normal Throat/Pharynx:  lips and gingiva without lesion; tongue and uvula midline; non-inflamed pharynx; no exudates or postnasal  drainage Neck: neck supple without adenopathy, thyromegaly, or masses Lungs:  clear to auscultation, breath sounds equal bilaterally, no respiratory distress Cardio:  regular rate and rhythm without murmurs, heart sounds without clicks or rubs Abdomen:  abdomen soft, nontender; bowel sounds normal; no masses or organomegaly Genital (male): circumcised penis, no lesions or discharge; testes present bilaterally without masses or tenderness Rectal: Deferred Musculoskeletal:  symmetrical muscle groups noted without atrophy or deformity, 5/5 strength throughout Extremities:  no clubbing, cyanosis, or edema, no deformities, no skin discoloration Neuro:  gait normal; deep tendon reflexes normal and symmetric Psych: well oriented with normal range of affect and appropriate judgment/insight  Assessment and Plan  Well adult  Elevated BP Recheck was not much better, Keep log and return in 4 weeks. Clean up diet.  Tobacco abuse - Plan: nicotine (NICODERM CQ - DOSED IN MG/24 HR) 7 mg/24hr patch, nicotine (NICODERM CQ - DOSED IN MG/24 HOURS) 21 mg/24hr patch, nicotine (NICODERM CQ - DOSED IN MG/24 HOURS) 14 mg/24hr patch   Obesity - Plan: Comprehensive metabolic panel  History of paresthesia- if it returns, will return to clinic and will likely obtain EMG  Screening for diabetes mellitus (DM) - Plan: Hemoglobin A1c  Screening cholesterol level - Plan: Lipid panel  Screening for HIV (human immunodeficiency virus) - Plan: HIV antibody   Well 30 y.o. male. Counseled on diet and exercise. Immunizations, labs, and further orders as above. Follow up in 4 weeks to recheck BP- he is to bring in BP readings to r/o [place] Syndrome. Will offer flu shot at that time. The patient voiced understanding and agreement to the plan.  [name], DO 01/08/16 4:00 PM

## 2016-01-08 NOTE — Patient Instructions (Addendum)

## 2016-01-09 LAB — COMPREHENSIVE METABOLIC PANEL
ALBUMIN: 4.7 g/dL (ref 3.5–5.2)
ALT: 32 U/L (ref 0–53)
AST: 21 U/L (ref 0–37)
Alkaline Phosphatase: 62 U/L (ref 39–117)
BILIRUBIN TOTAL: 0.4 mg/dL (ref 0.2–1.2)
BUN: 17 mg/dL (ref 6–23)
CHLORIDE: 100 meq/L (ref 96–112)
CO2: 31 meq/L (ref 19–32)
CREATININE: 1.14 mg/dL (ref 0.40–1.50)
Calcium: 9.8 mg/dL (ref 8.4–10.5)
GFR: 81.22 mL/min (ref >60.00)
Glucose, Bld: 73 mg/dL (ref 70–99)
Potassium: 4.7 mEq/L (ref 3.5–5.1)
SODIUM: 139 meq/L (ref 135–145)
Total Protein: 7.8 g/dL (ref 6.0–8.3)

## 2016-01-09 LAB — LIPID PANEL
CHOL/HDL RATIO: 3
CHOLESTEROL: 165 mg/dL (ref 0–200)
HDL: 55.1 mg/dL (ref >39.00)
LDL CALC: 94 mg/dL (ref 0–99)
NonHDL: 109.61
Triglycerides: 80 mg/dL (ref 0.0–149.0)
VLDL: 16 mg/dL (ref 0.0–40.0)

## 2016-01-09 LAB — HEMOGLOBIN A1C: Hgb A1c MFr Bld: 5.6 % (ref 4.6–6.5)

## 2016-01-09 LAB — HIV ANTIBODY (ROUTINE TESTING W REFLEX): HIV 1&2 Ab, 4th Generation: NONREACTIVE

## 2016-02-04 ENCOUNTER — Ambulatory Visit: Payer: Self-pay | Admitting: Family Medicine

## 2022-04-14 ENCOUNTER — Other Ambulatory Visit: Payer: Self-pay

## 2022-04-14 ENCOUNTER — Encounter (HOSPITAL_BASED_OUTPATIENT_CLINIC_OR_DEPARTMENT_OTHER): Payer: Self-pay | Admitting: Emergency Medicine

## 2022-04-14 ENCOUNTER — Emergency Department (HOSPITAL_BASED_OUTPATIENT_CLINIC_OR_DEPARTMENT_OTHER): Payer: Self-pay

## 2022-04-14 ENCOUNTER — Emergency Department (HOSPITAL_BASED_OUTPATIENT_CLINIC_OR_DEPARTMENT_OTHER)
Admission: EM | Admit: 2022-04-14 | Discharge: 2022-04-14 | Disposition: A | Discharge status: Home / Self Care | Payer: Self-pay | Attending: Emergency Medicine | Admitting: Emergency Medicine

## 2022-04-14 DIAGNOSIS — L03115 Cellulitis of right lower limb: Secondary | ICD-10-CM | POA: Insufficient documentation

## 2022-04-14 DIAGNOSIS — F17210 Nicotine dependence, cigarettes, uncomplicated: Secondary | ICD-10-CM | POA: Insufficient documentation

## 2022-04-14 MED ORDER — DOXYCYCLINE HYCLATE 100 MG PO CAPS: 100.0000 mg | ORAL_CAPSULE | Freq: Two times a day (BID) | ORAL | 0 refills | Status: AC

## 2022-04-14 NOTE — ED Triage Notes (Signed)
Right leg swelling at knee, feels like fluid building up and redness. Knee and ankle swelling noted in triage.

## 2022-04-14 NOTE — ED Provider Notes (Signed)
  Physical Exam  BP (!) 157/105   Pulse 68   Temp 97.8 F (36.6 C) (Oral)   Resp 17   Ht 6' (1.829 m)   Wt 77.1 kg   SpO2 99%   BMI 23.06 kg/m   Physical Exam  Procedures  Procedures  ED Course / MDM    Medical Decision Making Risk Prescription drug management.   Received in signout.  Lower leg erythema.  Negative Doppler.  Potentially cellulitis.  Will treat with an Onyx.  Also blood pressure elevated.  Discussed with patient need for follow-up but will not start medicines at this time.  Discharge home.       Benjiman Core, MD 04/14/22 [phone]

## 2022-04-14 NOTE — ED Provider Notes (Signed)
[facility]-EMERGENCY DEPT Provider Note: Lowella Dell, MD, FACEP  CSN: [id number] MRN: [id number] ARRIVAL: 04/14/22 at 0610 ROOM: [facility]09/[facility]09   CHIEF COMPLAINT  Leg Swelling   HISTORY OF PRESENT ILLNESS  04/14/22 6:24 AM Larry Hicks is a 36 y.o. 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  About a week ago he noticed some swelling overlying his right tibial tuberosity.  This was not associated with pain.  Since that time he has had the gradual onset of edema of the right lower leg with some mild erythema and warmth.  He continues to have no pain in that leg.  He is concerned about a DVT.  He has not had fever or chills.  He has had 2 ACL repairs of the right knee and has some surgical changes to that knee which she states have not acutely changed (except for the swelling overlying the tibial tuberosity).   Past Medical History:  Diagnosis Date   Chicken pox     Past Surgical History:  Procedure Laterality Date   ARTERY REPAIR Left 05/29/2012   Procedure: Repair of Left Radial Artery;  Surgeon: Larina Earthly, MD;  Location: [facility];  Service: Vascular;  Laterality: Left;   EMBOLECTOMY Left 05/29/2012   Procedure: Left Radial Artery Embolectomy;  Surgeon: Larina Earthly, MD;  Location: [facility] OR;  Service: Vascular;  Laterality: Left;   KNEE ARTHROSCOPY WITH ANTERIOR CRUCIATE LIGAMENT (ACL) REPAIR      Family History  Problem Relation Age of Onset   Diabetes Father     Social History   Tobacco Use   Smoking status: Some Days    Types: Cigarettes   Smokeless tobacco: Former    Quit date: 06/20/2005  Substance Use Topics   Alcohol use: No   Drug use: No    Prior to Admission medications   Medication Sig Start Date End Date Taking? Authorizing Provider  doxycycline (VIBRAMYCIN) 100 MG capsule Take 1 capsule (100 mg total) by mouth 2 (two) times daily. 04/14/22  Yes Benjiman Core, MD  amphetamine-dextroamphetamine (ADDERALL) 30 MG tablet Take by mouth 3 (three) times daily.    [provider]  omeprazole (PRILOSEC) 20 MG capsule Take 20 mg by mouth daily.    [provider]    Allergies Patient has no known allergies.   REVIEW OF SYSTEMS  Negative except as noted here or in the History of Present Illness.   PHYSICAL EXAMINATION  Initial Vital Signs Blood pressure (!) 166/106, pulse 85, temperature 97.8 F (36.6 C), temperature source Oral, resp. rate 18, height 6' (1.829 m), weight 77.1 kg, SpO2 100 %.  Examination General: Well-developed, well-nourished male in no acute distress; appearance consistent with age of record HENT: normocephalic; atraumatic Eyes: Normal appearance Neck: supple Heart: regular rate and rhythm Lungs: clear to auscultation bilaterally Abdomen: soft; nondistended; nontender; bowel sounds present Extremities: No deformity; pulses normal; surgical changes to right knee; thickened, callused skin overlying the right tibial tuberosity; edema, mild erythema and mild warmth of the right lower leg:    Neurologic: Awake, alert and oriented; motor function intact in all extremities and symmetric; no facial droop Skin: Warm and dry Psychiatric: Normal mood and affect   RESULTS  Summary of this visit's results, reviewed and interpreted by myself:   EKG Interpretation  Date/Time:    Ventricular Rate:    PR Interval:    QRS Duration:   QT Interval:    QTC Calculation:   R Axis:     Text  Interpretation:         Laboratory Studies: No results found for this or any previous visit (from the past 24 hour(s)). Imaging Studies: US Venous Img Lower Unilateral Right  Result Date: 04/14/2022 CLINICAL DATA:  Right lower extremity redness and swelling EXAM: RIGHT LOWER EXTREMITY VENOUS DOPPLER ULTRASOUND TECHNIQUE: Gray-scale sonography with compression, as well as color and duplex ultrasound, were performed to evaluate the deep venous system(s) from the level of the common femoral vein through the popliteal and proximal calf  veins. COMPARISON:  None Available. FINDINGS: VENOUS Normal compressibility of the common femoral, superficial femoral, and popliteal veins, as well as the visualized calf veins. Visualized portions of profunda femoral vein and great saphenous vein unremarkable. No filling defects to suggest DVT on grayscale or color Doppler imaging. Doppler waveforms show normal direction of venous flow, normal respiratory plasticity and response to augmentation. Limited views of the contralateral common femoral vein are unremarkable. OTHER None. Limitations: Prominent lymph nodes in the right inguinal station are very likely reactive. IMPRESSION: No evidence of deep venous thrombosis. Prominent lymph nodes in the right superficial inguinal nodal station are likely reactive to an underlying infectious/inflammatory process. Electronically Signed   By: Malachy Moan M.D.   On: 04/14/2022 07:53    ED COURSE and MDM  Nursing notes, initial and subsequent vitals signs, including pulse oximetry, reviewed and interpreted by myself.  Vitals:   04/14/22 0621 04/14/22 0622 04/14/22 0630 04/14/22 0753  BP: (!) 166/106  (!) 155/105 (!) 157/105  Pulse: 85  87 68  Resp: 18  17 17   Temp: 97.8 F (36.6 C)     TempSrc: Oral     SpO2: 100%  100% 99%  Weight:  77.1 kg    Height:  6' (1.829 m)     Medications - No data to display  Differential diagnosis includes deep vein thrombosis and cellulitis.  I do not believe he has an acute injury of the knee as he is without pain.  I do not believe he has an acute arthropathy of the knee again because he has no pain or appreciable effusion.  We will obtain a Doppler ultrasound to evaluate for DVT.  7:00 AM Signed out to Dr. 11-21-1984.  Awaiting Doppler ultrasound.  If Doppler ultrasound negative for DVT we will treat for cellulitis.  PROCEDURES  Procedures   ED DIAGNOSES     ICD-10-CM   1. Cellulitis of right lower extremity  L03.115          Rubin Payor, MD 04/14/22  2238
